# Patient Record
Sex: Female | Born: 1980
Health system: Southern US, Community
[De-identification: ages and names within clinical notes are randomized; demographics above are authoritative.]

## PROBLEM LIST (undated history)

## (undated) HISTORY — PX: TUBAL LIGATION: SHX77

---

## 2012-11-21 ENCOUNTER — Emergency Department (HOSPITAL_COMMUNITY)
Admission: EM | Admit: 2012-11-21 | Discharge: 2012-11-21 | Disposition: A | Discharge status: Home / Self Care | Payer: Medicaid Other | Attending: Emergency Medicine | Admitting: Emergency Medicine

## 2012-11-21 ENCOUNTER — Encounter (HOSPITAL_COMMUNITY): Payer: Self-pay | Admitting: *Deleted

## 2012-11-21 DIAGNOSIS — T380X5A Adverse effect of glucocorticoids and synthetic analogues, initial encounter: Secondary | ICD-10-CM | POA: Insufficient documentation

## 2012-11-21 DIAGNOSIS — Z79899 Other long term (current) drug therapy: Secondary | ICD-10-CM | POA: Insufficient documentation

## 2012-11-21 DIAGNOSIS — T380X1A Poisoning by glucocorticoids and synthetic analogues, accidental (unintentional), initial encounter: Secondary | ICD-10-CM | POA: Insufficient documentation

## 2012-11-21 DIAGNOSIS — Z889 Allergy status to unspecified drugs, medicaments and biological substances status: Secondary | ICD-10-CM

## 2012-11-21 MED ORDER — FAMOTIDINE 20 MG PO TABS
20.0000 mg | ORAL_TABLET | Freq: Once | ORAL | Status: AC
  Administered 2012-11-21: 20 mg via ORAL
  Filled 2012-11-21: qty 1

## 2012-11-21 NOTE — ED Notes (Signed)
Pt alert & oriented x4, stable gait. Patient given discharge instructions, paperwork & prescription(s). Patient  instructed to stop at the registration desk to finish any additional paperwork. Patient verbalized understanding. Pt left department w/ no further questions. 

## 2012-11-21 NOTE — ED Notes (Signed)
Pt w/ rash on arms & abdomen. Pt states her lips feel swollen. Denies any problems w/ tongue, swallowing or breathing. Pt took allergy pill around 2100.

## 2012-11-21 NOTE — ED Notes (Signed)
Pt was given an antibiotic and prednisone for an uri and ear infection. Pt states after taking prednisone she began to break out and itch.

## 2012-11-21 NOTE — ED Provider Notes (Signed)
History   This chart was scribed for Flint Melter, MD by Leone Payor, ED Scribe. This patient was seen in room APA03/[facility]03 and the patient's care was started 10:40 PM.   CSN: [id number]  Arrival date & time 11/21/12  2049   None     Chief Complaint  Patient presents with  . Rash  . Allergic Reaction     The history is provided by the patient. No language interpreter was used.    Diane Clements is a 32 y.o. female who presents to the Emergency Department complaining of a new, unchanged, constant rash to the arms, abdomen starting today. Pt was seen by PCP several days ago and was  prescribed azithromycin, tussionex, benadryl, and prednisone for an URI and ear infection and states that after taking the prednisone she began to break out and itch. Pt denies having allergies to medications in the past.    Pt denies smoking but occasionally uses alcohol.  History reviewed. No pertinent past medical history.  History reviewed. No pertinent past surgical history.  History reviewed. No pertinent family history.  History  Substance Use Topics  . Smoking status: Never Smoker   . Smokeless tobacco: Not on file  . Alcohol Use: Yes     Comment: occ    No OB history provided.   Review of Systems A complete 10 system review of systems was obtained and all systems are negative except as noted in the HPI and PMH.    Allergies  Review of patient's allergies indicates no known allergies.  Home Medications   Current Outpatient Rx  Name  Route  Sig  Dispense  Refill  . AZITHROMYCIN 250 MG PO TABS   Oral   Take 250-500 mg by mouth daily.         Marland Kitchen HYDROCOD POLST-CPM POLST ER 10-8 MG/5ML PO LQCR   Oral   Take 5 mLs by mouth every 12 (twelve) hours.         Marland Kitchen DIPHENHYDRAMINE HCL 25 MG PO TABS   Oral   Take 25 mg by mouth every 6 (six) hours as needed. allergies         . METHYLPREDNISOLONE 4 MG PO KIT   Oral   Take 4 mg by mouth as directed. follow package directions             BP 159/103  Pulse 117  Temp 98.2 F (36.8 C) (Oral)  Resp 20  Ht 5\' 2"  (1.575 m)  Wt 138 lb (62.596 kg)  BMI 25.24 kg/m2  SpO2 96%  Physical Exam  Nursing note and vitals reviewed. Constitutional: She is oriented to person, place, and time. She appears well-developed and well-nourished.  HENT:  Head: Normocephalic and atraumatic.  Eyes: Conjunctivae normal and EOM are normal. Pupils are equal, round, and reactive to light.  Neck: Normal range of motion and phonation normal. Neck supple.  Cardiovascular: Normal rate, regular rhythm and intact distal pulses.   Pulmonary/Chest: Effort normal and breath sounds normal. She exhibits no tenderness.  Abdominal: Soft. She exhibits no distension. There is no tenderness. There is no guarding.  Musculoskeletal: Normal range of motion.  Neurological: She is alert and oriented to person, place, and time. She has normal strength. She exhibits normal muscle tone.  Skin: Skin is warm and dry.  Psychiatric: She has a normal mood and affect. Her behavior is normal. Judgment and thought content normal.    ED Course  Procedures (including critical care time)  DIAGNOSTIC  STUDIES: Oxygen Saturation is 96% on room air, adequate by my interpretation.    COORDINATION OF CARE:  10:43 PM  Pt advised to discontinue use of chlorpheniramine and dextromethorphan  combination . She is advised to continue use of diphenhydramine, chlorpheniramine-hydrocodone, and methylprednisolone.      Nursing notes, applicable records and vitals reviewed.  Radiologic Images/Reports reviewed.   1. Drug allergy       MDM  Evaluation most consistent with drug reaction. Symptoms are mild. She is stable for discharge.      I personally performed the services described in this documentation, which was scribed in my presence. The recorded information has been reviewed and is accurate.      Plan: Home Medications- ad Pepcid prn, and as above; Home  Treatments- rest; Recommended follow up- PCP prn    Flint Melter, MD 11/21/12 2320

## 2017-07-02 DIAGNOSIS — J069 Acute upper respiratory infection, unspecified: Secondary | ICD-10-CM | POA: Diagnosis not present

## 2017-07-02 DIAGNOSIS — J329 Chronic sinusitis, unspecified: Secondary | ICD-10-CM | POA: Diagnosis not present

## 2017-10-08 DIAGNOSIS — Z6830 Body mass index (BMI) 30.0-30.9, adult: Secondary | ICD-10-CM | POA: Diagnosis not present

## 2017-10-08 DIAGNOSIS — Z1389 Encounter for screening for other disorder: Secondary | ICD-10-CM | POA: Diagnosis not present

## 2017-10-08 DIAGNOSIS — E6609 Other obesity due to excess calories: Secondary | ICD-10-CM | POA: Diagnosis not present

## 2017-10-08 DIAGNOSIS — M25511 Pain in right shoulder: Secondary | ICD-10-CM | POA: Diagnosis not present

## 2017-10-08 DIAGNOSIS — I10 Essential (primary) hypertension: Secondary | ICD-10-CM | POA: Diagnosis not present

## 2017-10-14 ENCOUNTER — Other Ambulatory Visit (HOSPITAL_COMMUNITY): Payer: Self-pay | Admitting: Family Medicine

## 2017-10-14 ENCOUNTER — Ambulatory Visit (HOSPITAL_COMMUNITY)
Admission: RE | Admit: 2017-10-14 | Discharge: 2017-10-14 | Disposition: A | Discharge status: Home / Self Care | Payer: BLUE CROSS/BLUE SHIELD | Source: Ambulatory Visit | Attending: Family Medicine | Admitting: Family Medicine

## 2017-10-14 DIAGNOSIS — M25511 Pain in right shoulder: Secondary | ICD-10-CM | POA: Diagnosis not present

## 2017-10-14 DIAGNOSIS — M25512 Pain in left shoulder: Secondary | ICD-10-CM | POA: Insufficient documentation

## 2017-11-03 ENCOUNTER — Ambulatory Visit: Payer: BLUE CROSS/BLUE SHIELD | Admitting: Orthopaedic Surgery

## 2017-11-04 ENCOUNTER — Ambulatory Visit (INDEPENDENT_AMBULATORY_CARE_PROVIDER_SITE_OTHER): Payer: BLUE CROSS/BLUE SHIELD | Admitting: Orthopaedic Surgery

## 2017-11-04 ENCOUNTER — Encounter: Payer: Self-pay | Admitting: Orthopaedic Surgery

## 2017-11-04 VITALS — BP 165/113 | HR 89 | Ht 63.0 in | Wt 170.0 lb

## 2017-11-04 DIAGNOSIS — M25511 Pain in right shoulder: Secondary | ICD-10-CM

## 2017-11-04 DIAGNOSIS — M25512 Pain in left shoulder: Secondary | ICD-10-CM

## 2017-11-04 NOTE — Patient Instructions (Signed)
Shoulder Exercises Ask your health care provider which exercises are safe for you. Do exercises exactly as told by your health care provider and adjust them as directed. It is normal to feel mild stretching, pulling, tightness, or discomfort as you do these exercises, but you should stop right away if you feel sudden pain or your pain gets worse.Do not begin these exercises until told by your health care provider. RANGE OF MOTION EXERCISES These exercises warm up your muscles and joints and improve the movement and flexibility of your shoulder. These exercises also help to relieve pain, numbness, and tingling. These exercises involve stretching your injured shoulder directly. Exercise A: Pendulum  1. Stand near a wall or a surface that you can hold onto for balance. 2. Bend at the waist and let your left / right arm hang straight down. Use your other arm to support you. Keep your back straight and do not lock your knees. 3. Relax your left / right arm and shoulder muscles, and move your hips and your trunk so your left / right arm swings freely. Your arm should swing because of the motion of your body, not because you are using your arm or shoulder muscles. 4. Keep moving your body so your arm swings in the following directions, as told by your health care provider: ? Side to side. ? Forward and backward. ? In clockwise and counterclockwise circles. 5. Continue each motion for __________ seconds, or for as long as told by your health care provider. 6. Slowly return to the starting position. Repeat __________ times. Complete this exercise __________ times a day. Exercise B:Flexion, Standing  1. Stand and hold a broomstick, a cane, or a similar object. Place your hands a little more than shoulder-width apart on the object. Your left / right hand should be palm-up, and your other hand should be palm-down. 2. Keep your elbow straight and keep your shoulder muscles relaxed. Push the stick down with  your healthy arm to raise your left / right arm in front of your body, and then over your head until you feel a stretch in your shoulder. ? Avoid shrugging your shoulder while you raise your arm. Keep your shoulder blade tucked down toward the middle of your back. 3. Hold for __________ seconds. 4. Slowly return to the starting position. Repeat __________ times. Complete this exercise __________ times a day. Exercise C: Abduction, Standing 1. Stand and hold a broomstick, a cane, or a similar object. Place your hands a little more than shoulder-width apart on the object. Your left / right hand should be palm-up, and your other hand should be palm-down. 2. While keeping your elbow straight and your shoulder muscles relaxed, push the stick across your body toward your left / right side. Raise your left / right arm to the side of your body and then over your head until you feel a stretch in your shoulder. ? Do not raise your arm above shoulder height, unless your health care provider tells you to do that. ? Avoid shrugging your shoulder while you raise your arm. Keep your shoulder blade tucked down toward the middle of your back. 3. Hold for __________ seconds. 4. Slowly return to the starting position. Repeat __________ times. Complete this exercise __________ times a day. Exercise D:Internal Rotation  1. Place your left / right hand behind your back, palm-up. 2. Use your other hand to dangle an exercise band, a towel, or a similar object over your shoulder. Grasp the band with   your left / right hand so you are holding onto both ends. 3. Gently pull up on the band until you feel a stretch in the front of your left / right shoulder. ? Avoid shrugging your shoulder while you raise your arm. Keep your shoulder blade tucked down toward the middle of your back. 4. Hold for __________ seconds. 5. Release the stretch by letting go of the band and lowering your hands. Repeat __________ times. Complete  this exercise __________ times a day. STRETCHING EXERCISES These exercises warm up your muscles and joints and improve the movement and flexibility of your shoulder. These exercises also help to relieve pain, numbness, and tingling. These exercises are done using your healthy shoulder to help stretch the muscles of your injured shoulder. Exercise E: Corner Stretch (External Rotation and Abduction)  1. Stand in a doorway with one of your feet slightly in front of the other. This is called a staggered stance. If you cannot reach your forearms to the door frame, stand facing a corner of a room. 2. Choose one of the following positions as told by your health care provider: ? Place your hands and forearms on the door frame above your head. ? Place your hands and forearms on the door frame at the height of your head. ? Place your hands on the door frame at the height of your elbows. 3. Slowly move your weight onto your front foot until you feel a stretch across your chest and in the front of your shoulders. Keep your head and chest upright and keep your abdominal muscles tight. 4. Hold for __________ seconds. 5. To release the stretch, shift your weight to your back foot. Repeat __________ times. Complete this stretch __________ times a day. Exercise F:Extension, Standing 1. Stand and hold a broomstick, a cane, or a similar object behind your back. ? Your hands should be a little wider than shoulder-width apart. ? Your palms should face away from your back. 2. Keeping your elbows straight and keeping your shoulder muscles relaxed, move the stick away from your body until you feel a stretch in your shoulder. ? Avoid shrugging your shoulders while you move the stick. Keep your shoulder blade tucked down toward the middle of your back. 3. Hold for __________ seconds. 4. Slowly return to the starting position. Repeat __________ times. Complete this exercise __________ times a day. STRENGTHENING  EXERCISES These exercises build strength and endurance in your shoulder. Endurance is the ability to use your muscles for a long time, even after they get tired. Exercise G:External Rotation  1. Sit in a stable chair without armrests. 2. Secure an exercise band at elbow height on your left / right side. 3. Place a soft object, such as a folded towel or a small pillow, between your left / right upper arm and your body to move your elbow a few inches away (about 10 cm) from your side. 4. Hold the end of the band so it is tight and there is no slack. 5. Keeping your elbow pressed against the soft object, move your left / right forearm out, away from your abdomen. Keep your body steady so only your forearm moves. 6. Hold for __________ seconds. 7. Slowly return to the starting position. Repeat __________ times. Complete this exercise __________ times a day. Exercise H:Shoulder Abduction  1. Sit in a stable chair without armrests, or stand. 2. Hold a __________ weight in your left / right hand, or hold an exercise band with both hands.   3. Start with your arms straight down and your left / right palm facing in, toward your body. 4. Slowly lift your left / right hand out to your side. Do not lift your hand above shoulder height unless your health care provider tells you that this is safe. ? Keep your arms straight. ? Avoid shrugging your shoulder while you do this movement. Keep your shoulder blade tucked down toward the middle of your back. 5. Hold for __________ seconds. 6. Slowly lower your arm, and return to the starting position. Repeat __________ times. Complete this exercise __________ times a day. Exercise I:Shoulder Extension 1. Sit in a stable chair without armrests, or stand. 2. Secure an exercise band to a stable object in front of you where it is at shoulder height. 3. Hold one end of the exercise band in each hand. Your palms should face each other. 4. Straighten your elbows and  lift your hands up to shoulder height. 5. Step back, away from the secured end of the exercise band, until the band is tight and there is no slack. 6. Squeeze your shoulder blades together as you pull your hands down to the sides of your thighs. Stop when your hands are straight down by your sides. Do not let your hands go behind your body. 7. Hold for __________ seconds. 8. Slowly return to the starting position. Repeat __________ times. Complete this exercise __________ times a day. Exercise J:Standing Shoulder Row 1. Sit in a stable chair without armrests, or stand. 2. Secure an exercise band to a stable object in front of you so it is at waist height. 3. Hold one end of the exercise band in each hand. Your palms should be in a thumbs-up position. 4. Bend each of your elbows to an "L" shape (about 90 degrees) and keep your upper arms at your sides. 5. Step back until the band is tight and there is no slack. 6. Slowly pull your elbows back behind you. 7. Hold for __________ seconds. 8. Slowly return to the starting position. Repeat __________ times. Complete this exercise __________ times a day. Exercise K:Shoulder Press-Ups  1. Sit in a stable chair that has armrests. Sit upright, with your feet flat on the floor. 2. Put your hands on the armrests so your elbows are bent and your fingers are pointing forward. Your hands should be about even with the sides of your body. 3. Push down on the armrests and use your arms to lift yourself off of the chair. Straighten your elbows and lift yourself up as much as you comfortably can. ? Move your shoulder blades down, and avoid letting your shoulders move up toward your ears. ? Keep your feet on the ground. As you get stronger, your feet should support less of your body weight as you lift yourself up. 4. Hold for __________ seconds. 5. Slowly lower yourself back into the chair. Repeat __________ times. Complete this exercise __________ times a  day. Exercise L: Wall Push-Ups  1. Stand so you are facing a stable wall. Your feet should be about one arm-length away from the wall. 2. Lean forward and place your palms on the wall at shoulder height. 3. Keep your feet flat on the floor as you bend your elbows and lean forward toward the wall. 4. Hold for __________ seconds. 5. Straighten your elbows to push yourself back to the starting position. Repeat __________ times. Complete this exercise __________ times a day. This information is not intended to replace advice   given to you by your health care provider. Make sure you discuss any questions you have with your health care provider. Document Released: 08/27/2005 Document Revised: 07/07/2016 Document Reviewed: 06/24/2015 Elsevier Interactive Patient Education  2018 [employer]  

## 2017-11-04 NOTE — Progress Notes (Signed)
Subjective:    Patient ID: Diane RiggerLakisha Clements, female    DOB: 02/17/1981, 37 y.o.   MRN: [id number]  HPI She has pain of both shoulders, more on the right for several months.  She has no trauma, no swelling, no redness, no weakness, no numbness. It has been getting slowly worse.  She works at [employer].  [facility]  X-rays were negative.  She has been on diclofenac which helps some, heat which helps at the time and ice.  She says cold weather makes her worse.   Review of Systems  HENT: Negative for congestion.   Respiratory: Negative for cough and shortness of breath.   Cardiovascular: Negative for chest pain and leg swelling.  Musculoskeletal: Positive for arthralgias.   History reviewed. No pertinent past medical history.  Past Surgical History:  Procedure Laterality Date  . TUBAL LIGATION      Current Outpatient Medications on File Prior to Visit  Medication Sig Dispense Refill  . amLODipine (NORVASC) 10 MG tablet Take 10 mg by mouth daily.  1  . diclofenac (VOLTAREN) 75 MG EC tablet Take 75 mg by mouth daily as needed.  1  . diphenhydrAMINE (BENADRYL) 25 MG tablet Take 25 mg by mouth every 6 (six) hours as needed. allergies     No current facility-administered medications on file prior to visit.     Social History   Socioeconomic History  . Marital status: Married    Spouse name: Not on file  . Number of children: Not on file  . Years of education: Not on file  . Highest education level: Not on file  Social Needs  . Financial resource strain: Not on file  . Food insecurity - worry: Not on file  . Food insecurity - inability: Not on file  . Transportation needs - medical: Not on file  . Transportation needs - non-medical: Not on file  Occupational History  . Not on file  Tobacco Use  . Smoking status: Never Smoker  . Smokeless tobacco: Never Used  Substance and Sexual Activity  . Alcohol use: Yes    Comment: occ  . Drug use: No  . Sexual  activity: Not on file  Other Topics Concern  . Not on file  Social History Narrative  . Not on file    Family History  Problem Relation Age of Onset  . CVA Mother   . Hypertension Mother   . Hypertension Brother     BP (!) 165/113   Pulse 89   Ht 5\' 3"  (1.6 m)   Wt 170 lb (77.1 kg)   BMI 30.11 kg/m      Objective:   Physical Exam  Constitutional: She is oriented to person, place, and time. She appears well-developed and well-nourished.  HENT:  Head: Normocephalic and atraumatic.  Eyes: Conjunctivae and EOM are normal. Pupils are equal, round, and reactive to light.  Neck: Normal range of motion. Neck supple.  Cardiovascular: Normal rate, regular rhythm and intact distal pulses.  Pulmonary/Chest: Effort normal.  Abdominal: Soft.  Musculoskeletal: She exhibits tenderness (Shoulders have full ROM bilaterally, right is more tender more in the extremes.  NV intact.  No effusion or  redness.).  Neurological: She is alert and oriented to person, place, and time. She displays normal reflexes. No cranial nerve deficit. She exhibits normal muscle tone. Coordination normal.  Skin: Skin is warm and dry.  Psychiatric: She has a normal mood and affect. Her behavior is normal. Judgment  and thought content normal.  Vitals reviewed.         Assessment & Plan:   Encounter Diagnosis  Name Primary?  . Pain of both shoulder joints Yes   Begin PT/OT.  Continue diclofenac.  Return in three weeks.  Call if any problem.  Precautions discussed.   Electronically Signed Darreld Mclean, MD 1/9/20192:16 PM

## 2017-11-25 ENCOUNTER — Ambulatory Visit: Payer: BLUE CROSS/BLUE SHIELD | Admitting: Orthopaedic Surgery

## 2018-10-20 DIAGNOSIS — J3489 Other specified disorders of nose and nasal sinuses: Secondary | ICD-10-CM | POA: Diagnosis not present

## 2018-10-20 DIAGNOSIS — Z79899 Other long term (current) drug therapy: Secondary | ICD-10-CM | POA: Diagnosis not present

## 2018-10-20 DIAGNOSIS — B9789 Other viral agents as the cause of diseases classified elsewhere: Secondary | ICD-10-CM | POA: Diagnosis not present

## 2018-10-20 DIAGNOSIS — I10 Essential (primary) hypertension: Secondary | ICD-10-CM | POA: Diagnosis not present

## 2018-10-20 DIAGNOSIS — J069 Acute upper respiratory infection, unspecified: Secondary | ICD-10-CM | POA: Diagnosis not present

## 2018-11-11 DIAGNOSIS — R5383 Other fatigue: Secondary | ICD-10-CM | POA: Diagnosis not present

## 2018-11-11 DIAGNOSIS — L409 Psoriasis, unspecified: Secondary | ICD-10-CM | POA: Diagnosis not present

## 2018-11-11 DIAGNOSIS — E6609 Other obesity due to excess calories: Secondary | ICD-10-CM | POA: Diagnosis not present

## 2018-11-11 DIAGNOSIS — I10 Essential (primary) hypertension: Secondary | ICD-10-CM | POA: Diagnosis not present

## 2018-11-11 DIAGNOSIS — Z1389 Encounter for screening for other disorder: Secondary | ICD-10-CM | POA: Diagnosis not present

## 2018-11-11 DIAGNOSIS — Z0001 Encounter for general adult medical examination with abnormal findings: Secondary | ICD-10-CM | POA: Diagnosis not present

## 2018-11-11 DIAGNOSIS — Z6830 Body mass index (BMI) 30.0-30.9, adult: Secondary | ICD-10-CM | POA: Diagnosis not present

## 2019-02-24 DIAGNOSIS — Z1389 Encounter for screening for other disorder: Secondary | ICD-10-CM | POA: Diagnosis not present

## 2019-02-24 DIAGNOSIS — E7849 Other hyperlipidemia: Secondary | ICD-10-CM | POA: Diagnosis not present

## 2019-02-24 DIAGNOSIS — L409 Psoriasis, unspecified: Secondary | ICD-10-CM | POA: Diagnosis not present

## 2019-02-24 DIAGNOSIS — E663 Overweight: Secondary | ICD-10-CM | POA: Diagnosis not present

## 2019-02-24 DIAGNOSIS — Z6829 Body mass index (BMI) 29.0-29.9, adult: Secondary | ICD-10-CM | POA: Diagnosis not present

## 2019-05-21 IMAGING — DX DG SHOULDER 2+V*R*
3 series · 3 of 3 positions shown · non-contrast
Comparison: None.

CLINICAL DATA: Bilateral shoulder pain, right worse than left, for
a few years.

EXAM:
RIGHT SHOULDER - 2+ VIEW

[shoulder grashey]
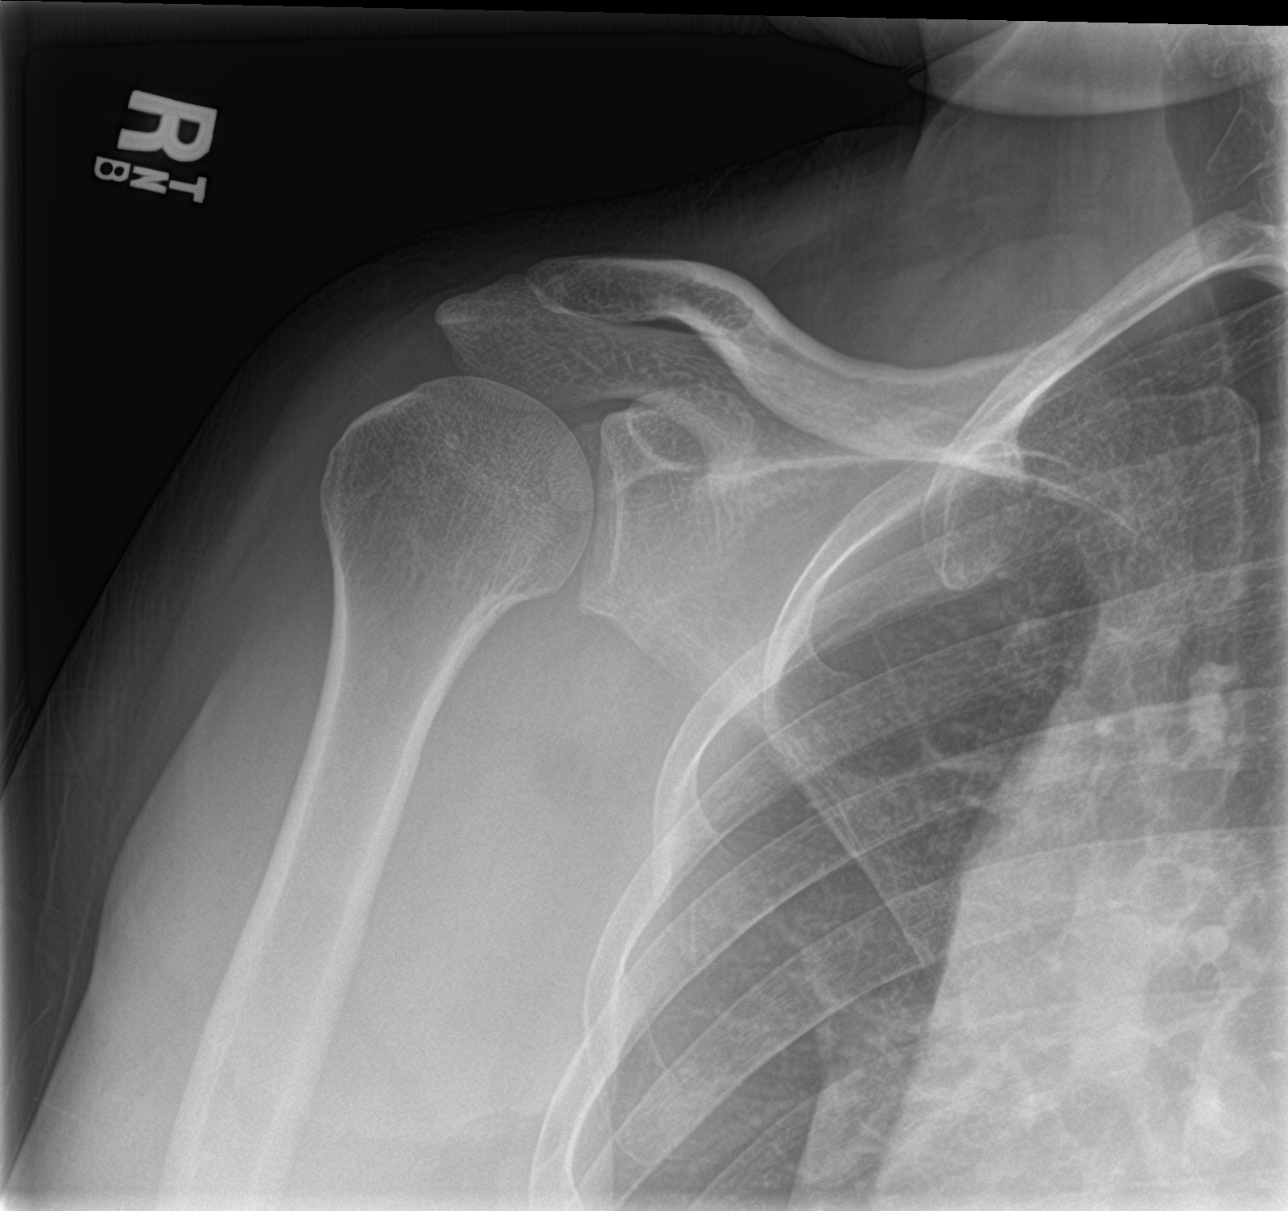

[shoulder y view]
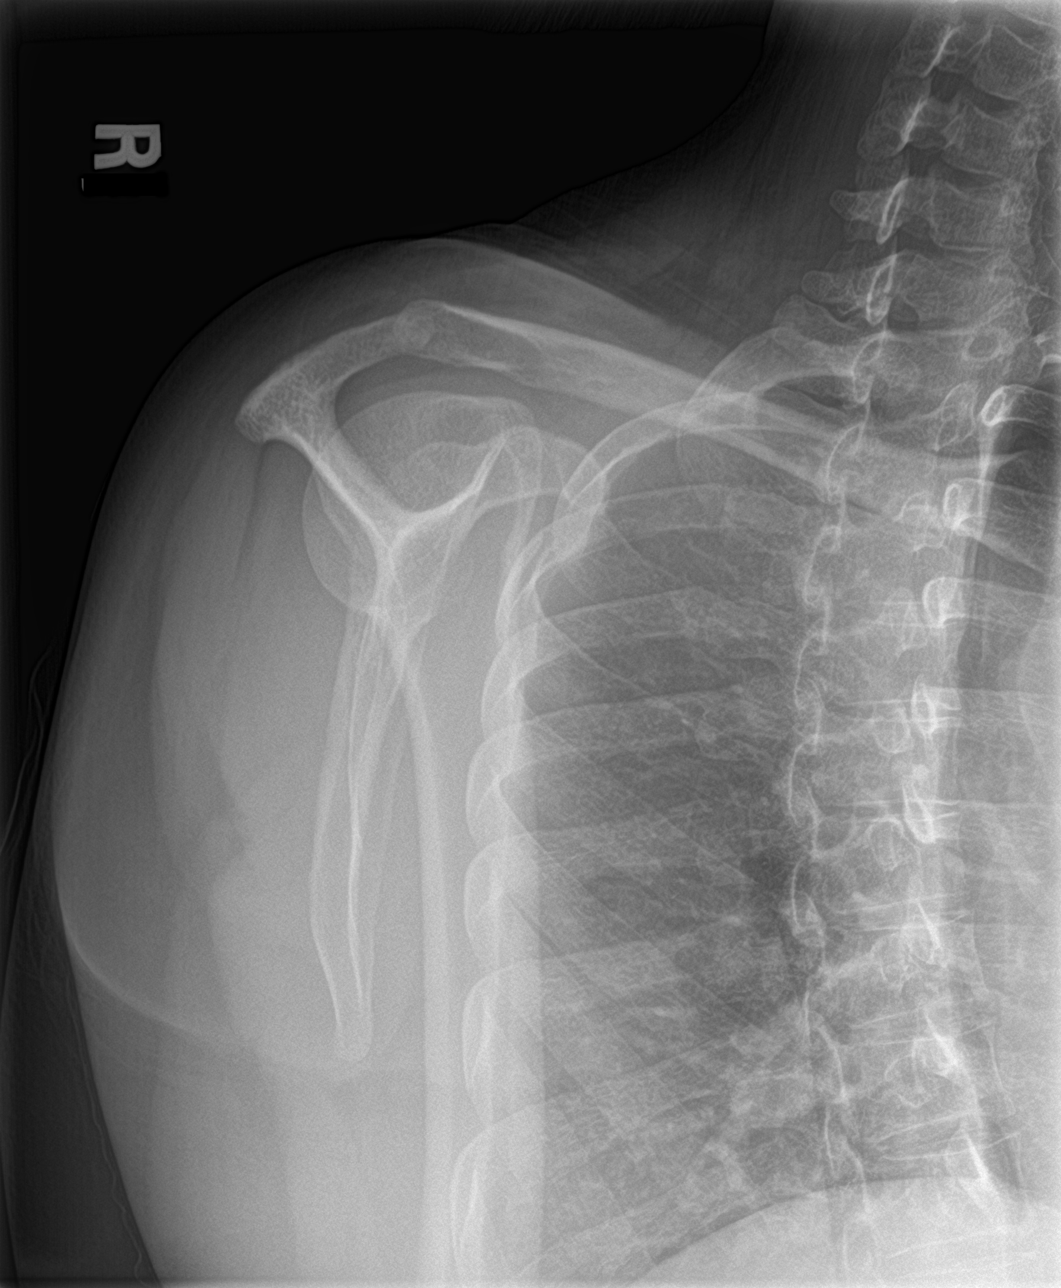

[shoulder axillary]
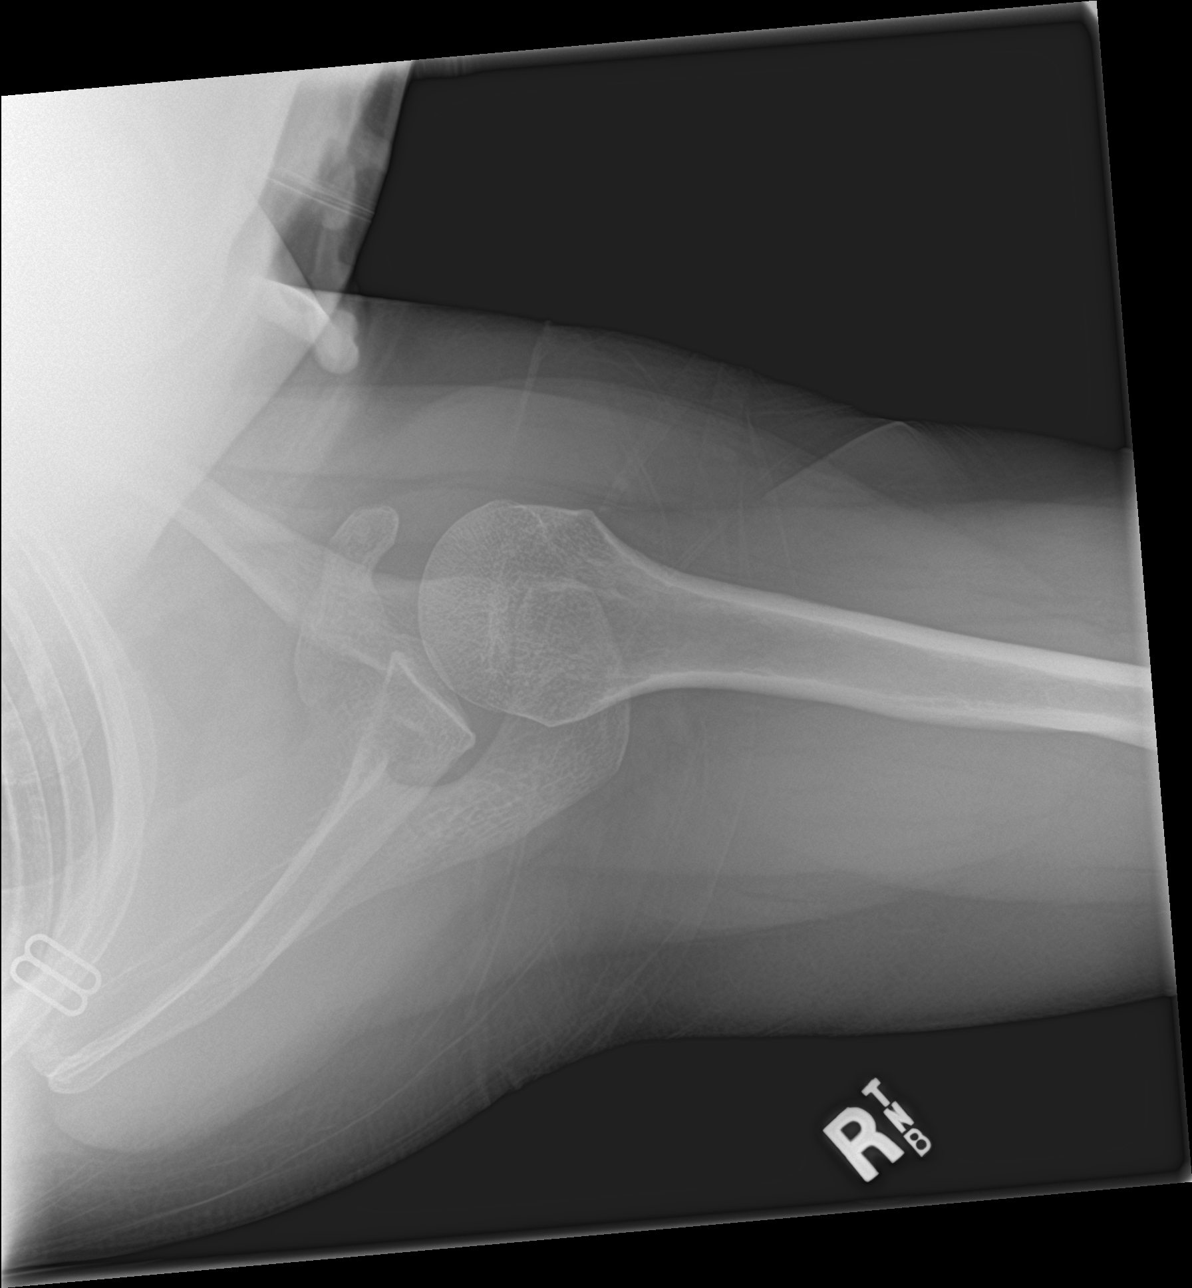

[3 of 3 positions shown; findings below may reference images not displayed]

FINDINGS: There is no evidence of fracture or dislocation. There is no
evidence of arthropathy or other focal bone abnormality. Soft
tissues are unremarkable.
IMPRESSION: Negative.

## 2019-05-21 IMAGING — DX DG SHOULDER 2+V*L*
3 series · 3 of 3 positions shown · non-contrast
Comparison: None.

CLINICAL DATA: Bilateral shoulder pain for a few years.

EXAM:
LEFT SHOULDER - 2+ VIEW

[shoulder grashey]
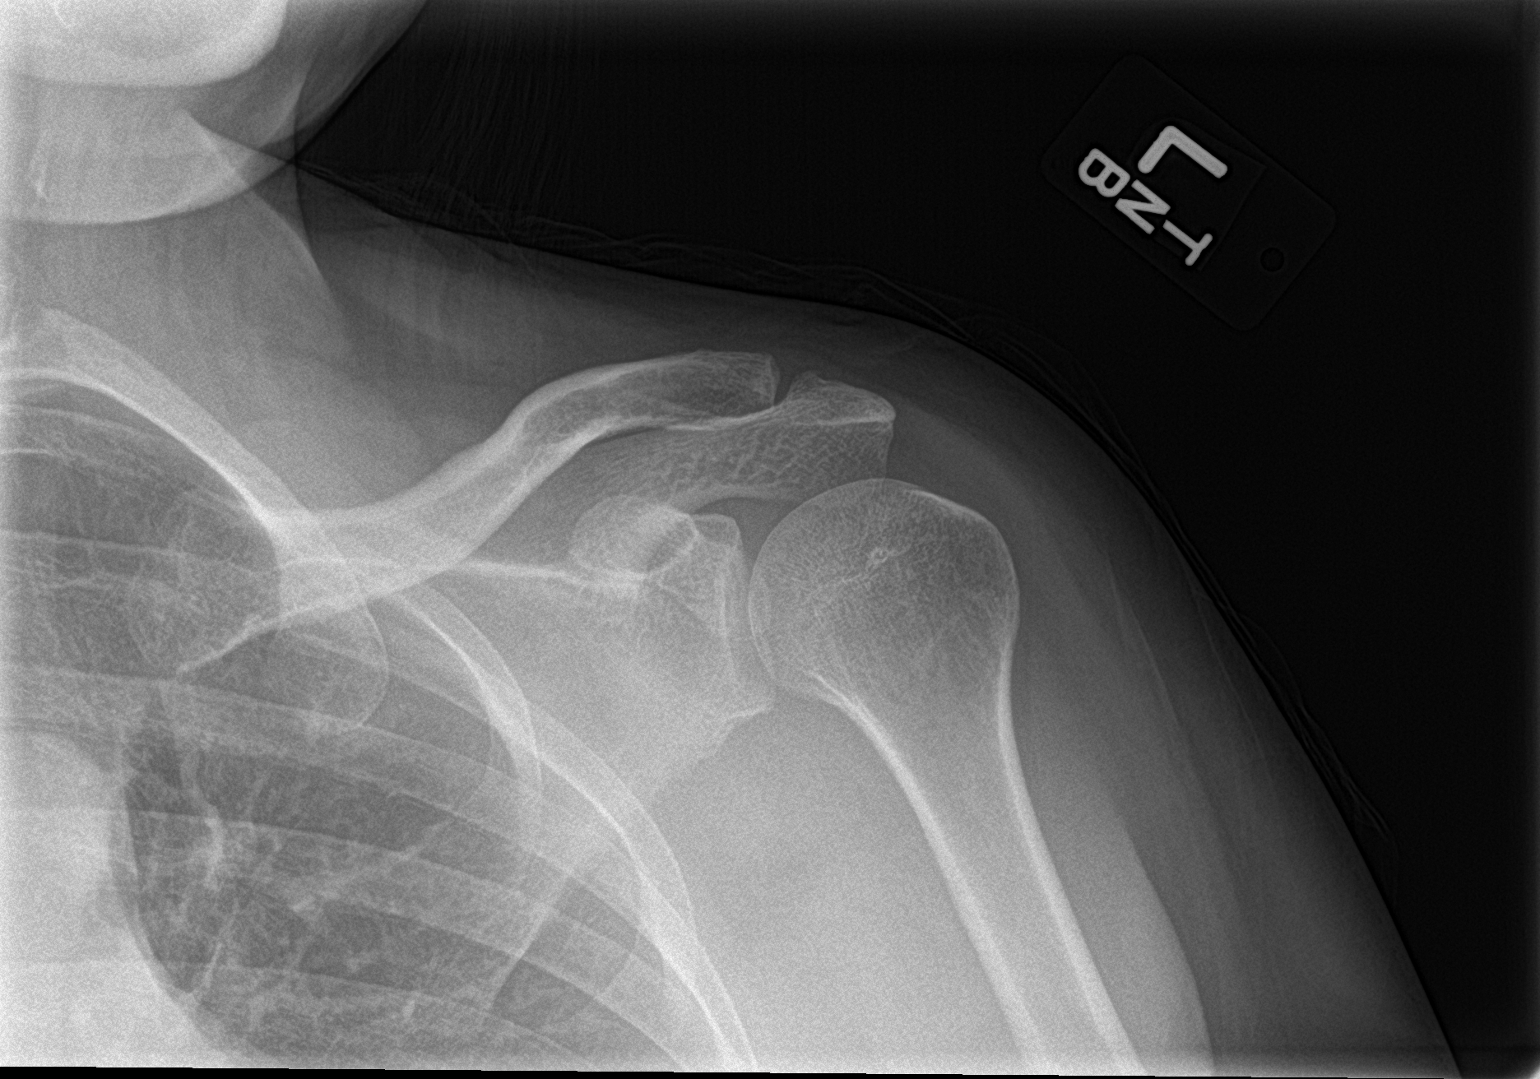

[shoulder y view]
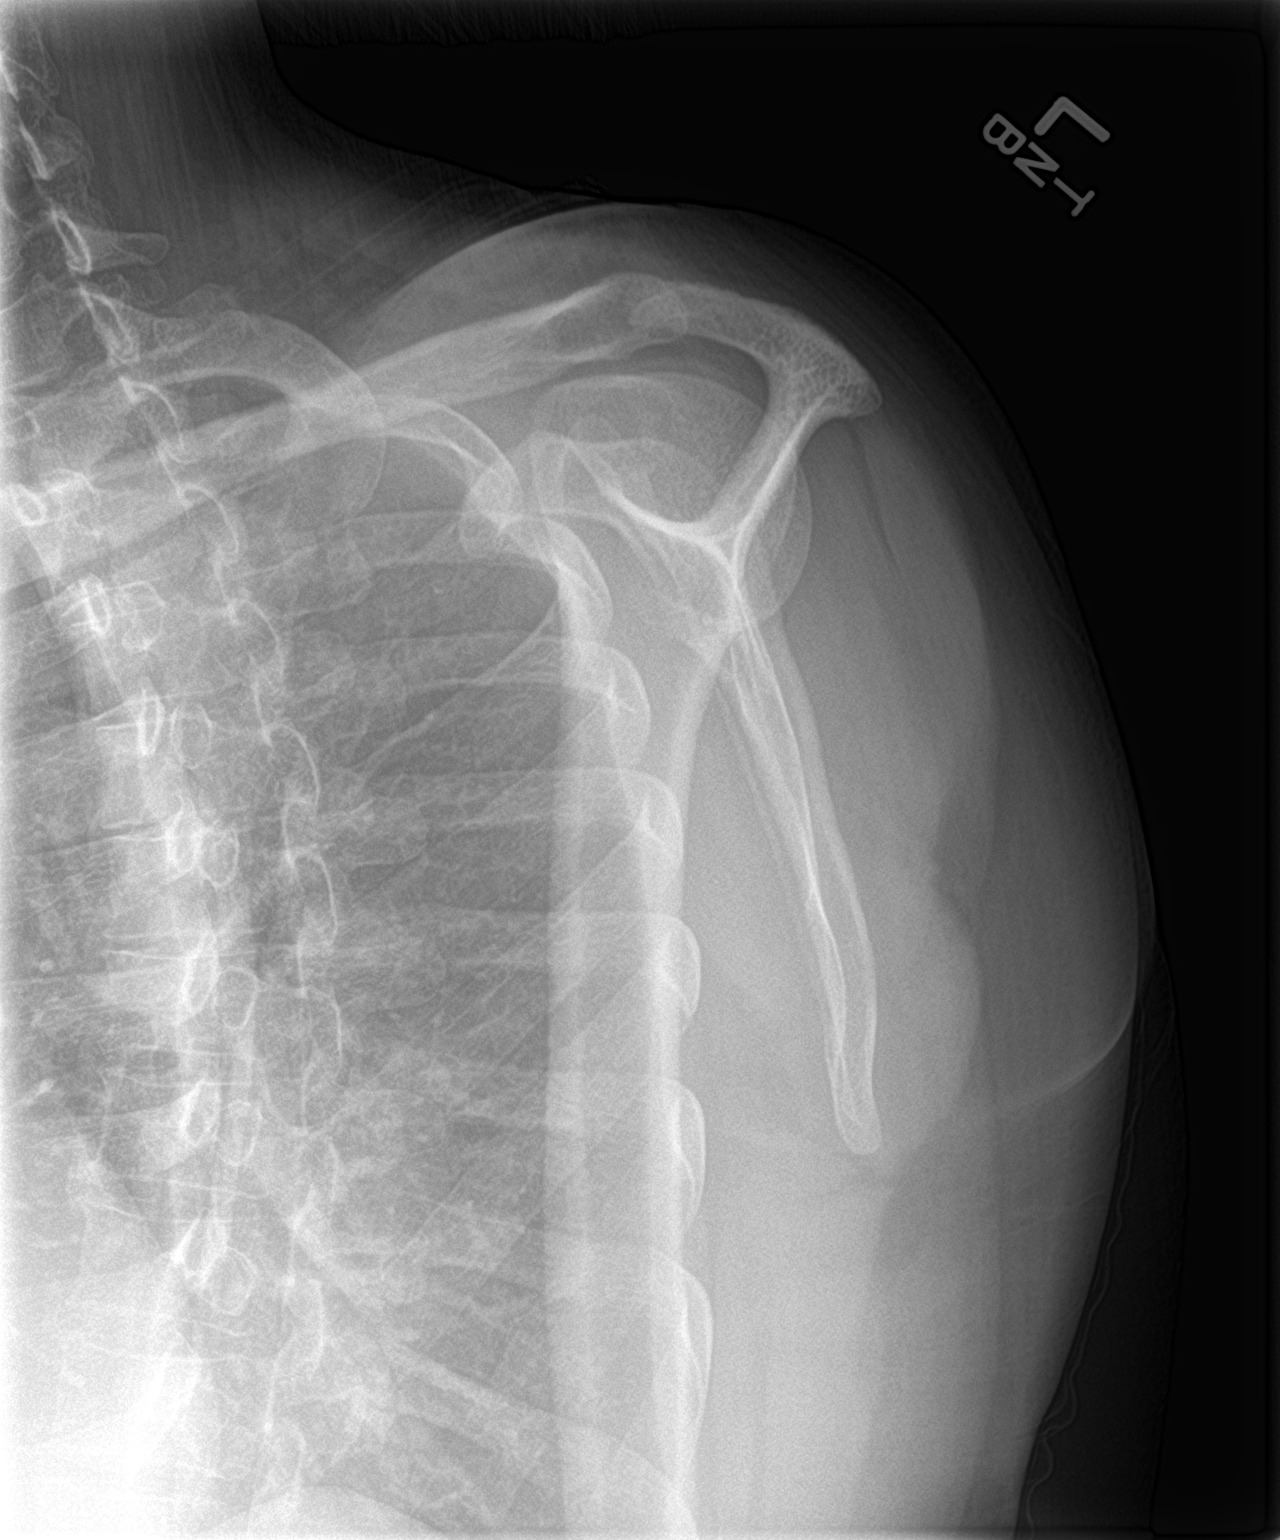

[shoulder axillary]
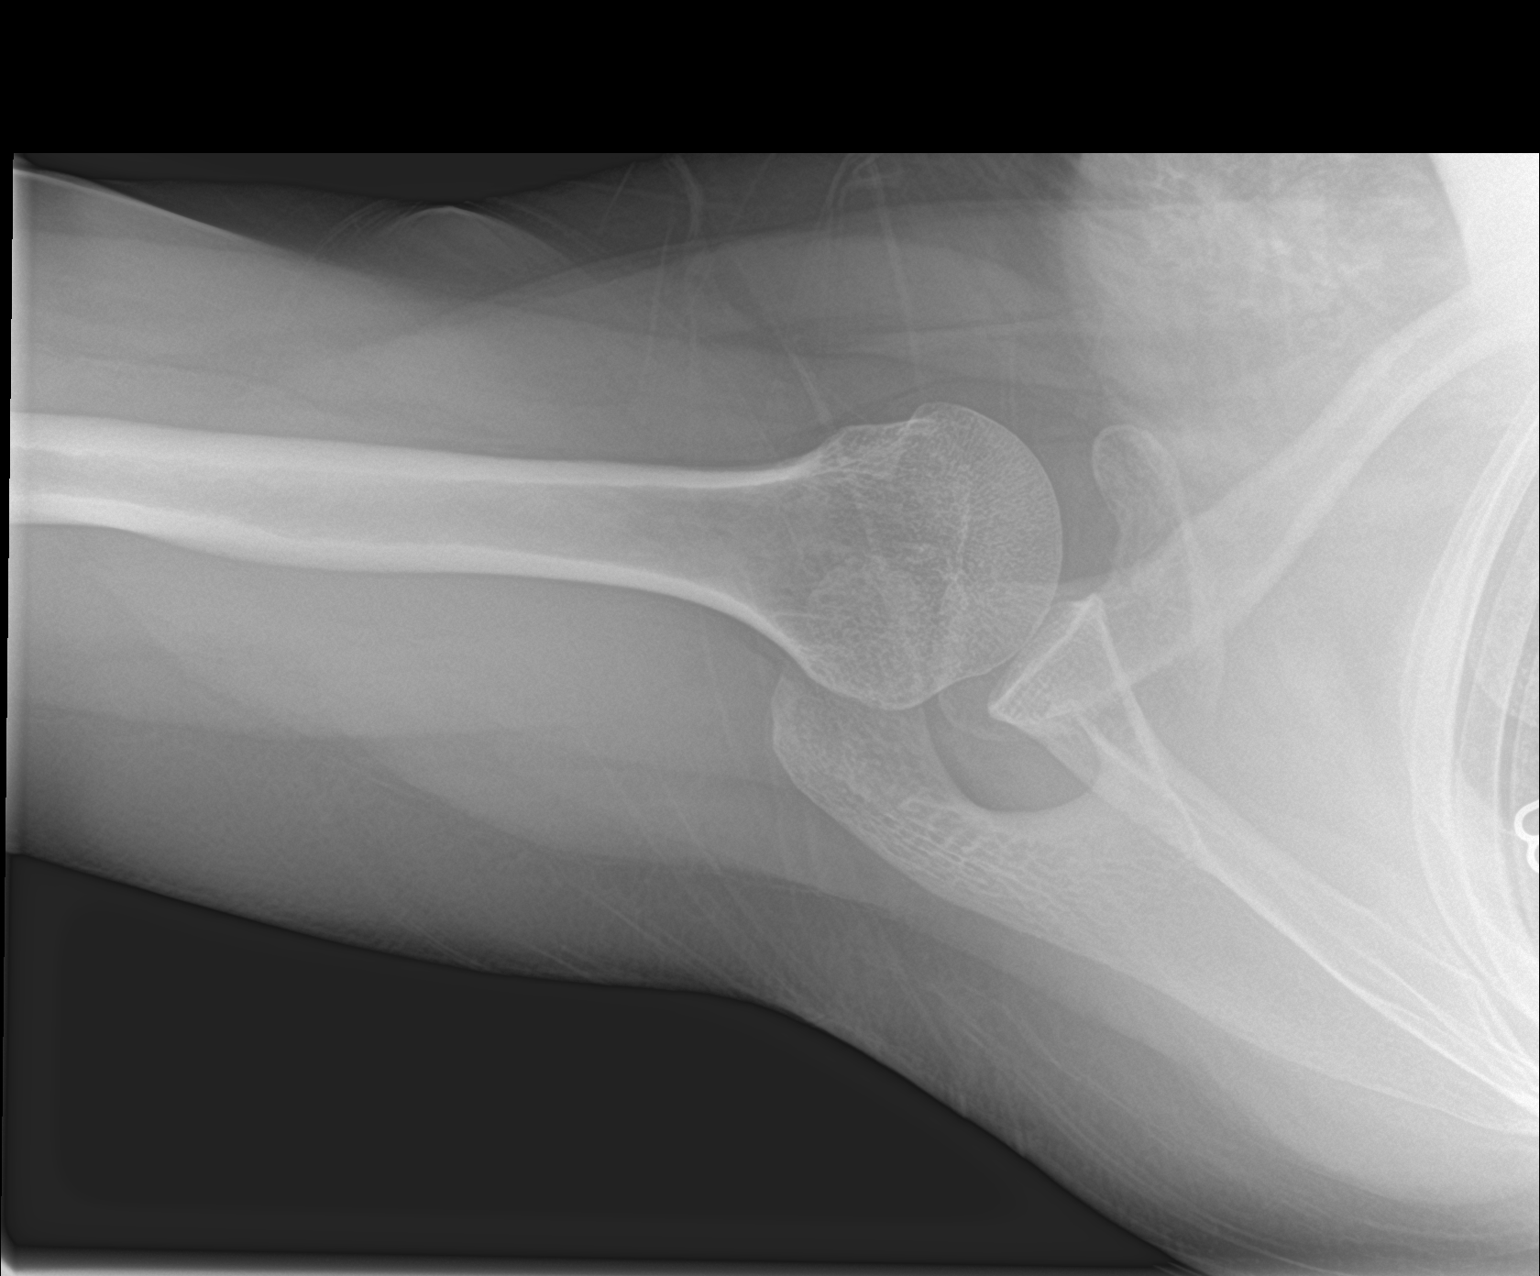

[3 of 3 positions shown; findings below may reference images not displayed]

FINDINGS: There is no evidence of fracture or dislocation. There is no
evidence of arthropathy or other focal bone abnormality.
Heterogeneous density on the axillary view is likely deodorant.
IMPRESSION: Negative.

## 2019-12-01 DIAGNOSIS — E7849 Other hyperlipidemia: Secondary | ICD-10-CM | POA: Diagnosis not present

## 2019-12-01 DIAGNOSIS — Z124 Encounter for screening for malignant neoplasm of cervix: Secondary | ICD-10-CM | POA: Diagnosis not present

## 2019-12-01 DIAGNOSIS — I10 Essential (primary) hypertension: Secondary | ICD-10-CM | POA: Diagnosis not present

## 2019-12-01 DIAGNOSIS — E6609 Other obesity due to excess calories: Secondary | ICD-10-CM | POA: Diagnosis not present

## 2019-12-01 DIAGNOSIS — Z01411 Encounter for gynecological examination (general) (routine) with abnormal findings: Secondary | ICD-10-CM | POA: Diagnosis not present

## 2019-12-01 DIAGNOSIS — Z6830 Body mass index (BMI) 30.0-30.9, adult: Secondary | ICD-10-CM | POA: Diagnosis not present

## 2019-12-01 DIAGNOSIS — N6320 Unspecified lump in the left breast, unspecified quadrant: Secondary | ICD-10-CM | POA: Diagnosis not present

## 2021-06-17 DIAGNOSIS — L409 Psoriasis, unspecified: Secondary | ICD-10-CM | POA: Diagnosis not present

## 2021-06-17 DIAGNOSIS — L309 Dermatitis, unspecified: Secondary | ICD-10-CM | POA: Diagnosis not present

## 2021-06-26 DIAGNOSIS — Z79899 Other long term (current) drug therapy: Secondary | ICD-10-CM | POA: Diagnosis not present

## 2021-06-26 DIAGNOSIS — L400 Psoriasis vulgaris: Secondary | ICD-10-CM | POA: Diagnosis not present

## 2021-09-04 DIAGNOSIS — J101 Influenza due to other identified influenza virus with other respiratory manifestations: Secondary | ICD-10-CM | POA: Diagnosis not present

## 2021-09-04 DIAGNOSIS — M791 Myalgia, unspecified site: Secondary | ICD-10-CM | POA: Diagnosis not present

## 2021-09-15 DIAGNOSIS — J019 Acute sinusitis, unspecified: Secondary | ICD-10-CM | POA: Diagnosis not present

## 2021-09-15 DIAGNOSIS — H6691 Otitis media, unspecified, right ear: Secondary | ICD-10-CM | POA: Diagnosis not present

## 2021-09-15 DIAGNOSIS — J029 Acute pharyngitis, unspecified: Secondary | ICD-10-CM | POA: Diagnosis not present

## 2021-10-08 DIAGNOSIS — M25512 Pain in left shoulder: Secondary | ICD-10-CM | POA: Diagnosis not present

## 2021-10-09 DIAGNOSIS — N6322 Unspecified lump in the left breast, upper inner quadrant: Secondary | ICD-10-CM | POA: Diagnosis not present

## 2021-10-09 DIAGNOSIS — R922 Inconclusive mammogram: Secondary | ICD-10-CM | POA: Diagnosis not present

## 2021-11-02 DIAGNOSIS — J209 Acute bronchitis, unspecified: Secondary | ICD-10-CM | POA: Diagnosis not present

## 2022-01-21 DIAGNOSIS — L738 Other specified follicular disorders: Secondary | ICD-10-CM | POA: Diagnosis not present

## 2022-01-21 DIAGNOSIS — L400 Psoriasis vulgaris: Secondary | ICD-10-CM | POA: Diagnosis not present

## 2022-01-21 DIAGNOSIS — Z79899 Other long term (current) drug therapy: Secondary | ICD-10-CM | POA: Diagnosis not present

## 2022-01-22 DIAGNOSIS — Z79899 Other long term (current) drug therapy: Secondary | ICD-10-CM | POA: Diagnosis not present

## 2022-03-05 DIAGNOSIS — Z79899 Other long term (current) drug therapy: Secondary | ICD-10-CM | POA: Diagnosis not present

## 2022-03-05 DIAGNOSIS — L400 Psoriasis vulgaris: Secondary | ICD-10-CM | POA: Diagnosis not present

## 2022-04-09 DIAGNOSIS — R922 Inconclusive mammogram: Secondary | ICD-10-CM | POA: Diagnosis not present

## 2022-04-09 DIAGNOSIS — N6321 Unspecified lump in the left breast, upper outer quadrant: Secondary | ICD-10-CM | POA: Diagnosis not present

## 2022-04-09 DIAGNOSIS — N6322 Unspecified lump in the left breast, upper inner quadrant: Secondary | ICD-10-CM | POA: Diagnosis not present

## 2022-05-29 DIAGNOSIS — Z743 Need for continuous supervision: Secondary | ICD-10-CM | POA: Diagnosis not present

## 2022-05-29 DIAGNOSIS — R42 Dizziness and giddiness: Secondary | ICD-10-CM | POA: Diagnosis not present

## 2022-05-29 DIAGNOSIS — R000 Tachycardia, unspecified: Secondary | ICD-10-CM | POA: Diagnosis not present

## 2022-06-04 DIAGNOSIS — Z79899 Other long term (current) drug therapy: Secondary | ICD-10-CM | POA: Diagnosis not present

## 2022-06-04 DIAGNOSIS — L400 Psoriasis vulgaris: Secondary | ICD-10-CM | POA: Diagnosis not present

## 2022-09-15 DIAGNOSIS — E6609 Other obesity due to excess calories: Secondary | ICD-10-CM | POA: Diagnosis not present

## 2022-09-15 DIAGNOSIS — Z0001 Encounter for general adult medical examination with abnormal findings: Secondary | ICD-10-CM | POA: Diagnosis not present

## 2022-09-15 DIAGNOSIS — Z6831 Body mass index (BMI) 31.0-31.9, adult: Secondary | ICD-10-CM | POA: Diagnosis not present

## 2022-09-15 DIAGNOSIS — E782 Mixed hyperlipidemia: Secondary | ICD-10-CM | POA: Diagnosis not present

## 2022-09-15 DIAGNOSIS — Z1331 Encounter for screening for depression: Secondary | ICD-10-CM | POA: Diagnosis not present

## 2022-09-15 DIAGNOSIS — I10 Essential (primary) hypertension: Secondary | ICD-10-CM | POA: Diagnosis not present

## 2022-09-29 DIAGNOSIS — E7849 Other hyperlipidemia: Secondary | ICD-10-CM | POA: Diagnosis not present

## 2022-09-29 DIAGNOSIS — Z0001 Encounter for general adult medical examination with abnormal findings: Secondary | ICD-10-CM | POA: Diagnosis not present

## 2022-12-10 DIAGNOSIS — L408 Other psoriasis: Secondary | ICD-10-CM | POA: Diagnosis not present

## 2022-12-10 DIAGNOSIS — Z79899 Other long term (current) drug therapy: Secondary | ICD-10-CM | POA: Diagnosis not present

## 2022-12-10 DIAGNOSIS — L738 Other specified follicular disorders: Secondary | ICD-10-CM | POA: Diagnosis not present

## 2022-12-10 DIAGNOSIS — L400 Psoriasis vulgaris: Secondary | ICD-10-CM | POA: Diagnosis not present

## 2023-03-18 DIAGNOSIS — F32A Depression, unspecified: Secondary | ICD-10-CM | POA: Diagnosis not present

## 2023-03-18 DIAGNOSIS — I10 Essential (primary) hypertension: Secondary | ICD-10-CM | POA: Diagnosis not present

## 2023-03-18 DIAGNOSIS — Z0000 Encounter for general adult medical examination without abnormal findings: Secondary | ICD-10-CM | POA: Diagnosis not present

## 2023-03-18 DIAGNOSIS — F419 Anxiety disorder, unspecified: Secondary | ICD-10-CM | POA: Diagnosis not present

## 2023-03-18 DIAGNOSIS — Z881 Allergy status to other antibiotic agents status: Secondary | ICD-10-CM | POA: Diagnosis not present

## 2023-03-18 DIAGNOSIS — R202 Paresthesia of skin: Secondary | ICD-10-CM | POA: Diagnosis not present

## 2023-04-29 DIAGNOSIS — Z803 Family history of malignant neoplasm of breast: Secondary | ICD-10-CM | POA: Diagnosis not present

## 2023-04-29 DIAGNOSIS — R92333 Mammographic heterogeneous density, bilateral breasts: Secondary | ICD-10-CM | POA: Diagnosis not present

## 2023-04-29 DIAGNOSIS — N6459 Other signs and symptoms in breast: Secondary | ICD-10-CM | POA: Diagnosis not present

## 2023-04-29 DIAGNOSIS — R928 Other abnormal and inconclusive findings on diagnostic imaging of breast: Secondary | ICD-10-CM | POA: Diagnosis not present

## 2023-04-29 DIAGNOSIS — N6322 Unspecified lump in the left breast, upper inner quadrant: Secondary | ICD-10-CM | POA: Diagnosis not present

## 2023-05-22 DIAGNOSIS — E785 Hyperlipidemia, unspecified: Secondary | ICD-10-CM | POA: Diagnosis not present

## 2023-05-22 DIAGNOSIS — L409 Psoriasis, unspecified: Secondary | ICD-10-CM | POA: Diagnosis not present

## 2023-05-22 DIAGNOSIS — I10 Essential (primary) hypertension: Secondary | ICD-10-CM | POA: Diagnosis not present

## 2023-05-22 DIAGNOSIS — Z7689 Persons encountering health services in other specified circumstances: Secondary | ICD-10-CM | POA: Diagnosis not present

## 2023-05-22 DIAGNOSIS — Z79899 Other long term (current) drug therapy: Secondary | ICD-10-CM | POA: Diagnosis not present
